# Patient Record
Sex: Female | Born: 1985
Health system: Southern US, Community
[De-identification: ages and names within clinical notes are randomized; demographics above are authoritative.]

## PROBLEM LIST (undated history)

## (undated) DIAGNOSIS — F419 Anxiety disorder, unspecified: Secondary | ICD-10-CM

## (undated) DIAGNOSIS — J189 Pneumonia, unspecified organism: Secondary | ICD-10-CM

## (undated) HISTORY — DX: Anxiety disorder, unspecified: F41.9

## (undated) HISTORY — DX: Pneumonia, unspecified organism: J18.9

---

## 2009-09-02 ENCOUNTER — Encounter: Payer: Self-pay | Admitting: Cardiology

## 2009-09-02 ENCOUNTER — Ambulatory Visit: Payer: Self-pay | Admitting: Family Medicine

## 2009-09-02 DIAGNOSIS — R079 Chest pain, unspecified: Secondary | ICD-10-CM | POA: Insufficient documentation

## 2009-09-03 ENCOUNTER — Encounter: Payer: Self-pay | Admitting: Family Medicine

## 2009-09-14 ENCOUNTER — Encounter: Payer: Self-pay | Admitting: Cardiology

## 2009-09-14 ENCOUNTER — Ambulatory Visit: Payer: Self-pay | Admitting: Diagnostic Radiology

## 2009-09-14 ENCOUNTER — Ambulatory Visit (HOSPITAL_BASED_OUTPATIENT_CLINIC_OR_DEPARTMENT_OTHER): Admission: RE | Admit: 2009-09-14 | Discharge: 2009-09-14 | Payer: Self-pay | Admitting: Cardiology

## 2009-09-14 ENCOUNTER — Ambulatory Visit: Payer: Self-pay | Admitting: Cardiology

## 2010-07-25 NOTE — Assessment & Plan Note (Signed)
Summary: [facility]   Visit Type:  Initial Consult  CC:  chest pain.  History of Present Illness: 25 yo female for evaluation of chest pain. No prior cardiac history. She typically does not have dyspnea on exertion, orthopnea, PND, pedal edema, palpitations, syncope or exertional chest pain. She states she had a pneumonia approximately 2 years ago. Since then she has had occasional chest pain under her left breast. It is described as a sharp pain. It does not radiate. It is not exertional or related to food. He can increase with inspiration. It lasts one to 2 seconds and resolve spontaneously. Because of her chest pain she presented for further evaluation.  Current Medications (verified): 1)  Depo-Provera 150 Mg/ml Susp (Medroxyprogesterone Acetate) .... Injection Every Three Months  Allergies (verified): No Known Drug Allergies  Past History:  Past Medical History: Pneumonia  Past Surgical History: Reviewed history from 09/02/2009 and no changes required. Denies surgical history  Family History: Reviewed history from 09/02/2009 and no changes required. Family History Diabetes 1st degree relative Family History Hypertension No premature CAD in immediate family  Social History: Reviewed history from 09/02/2009 and no changes required. Single Current Smoker - socially Alcohol use-yes very seldomly Drug use-no Regular exercise-yes Full Time  Review of Systems       no fevers or chills, productive cough, hemoptysis, dysphasia, odynophagia, melena, hematochezia, dysuria, hematuria, rash, seizure activity, orthopnea, PND, pedal edema, claudication. Remaining systems are negative.   Vital Signs:  Patient profile:   25 year old female Height:      63 inches Weight:      124.50 pounds BMI:     22.13 Pulse rate:   60 / minute Pulse rhythm:   regular Resp:     18 per minute BP sitting:   110 / 70  (left arm) Cuff size:   regular  Vitals Entered By: Vikki Ports  (September 14, 2009 9:29 AM)  Physical Exam  General:  Well developed/well nourished in NAD Skin warm/dry Patient not depressed No peripheral clubbing Back-normal HEENT-normal/normal eyelids Neck supple/normal carotid upstroke bilaterally; no bruits; no JVD; no thyromegaly chest - CTA/ normal expansion CV - RRR/normal S1 and S2; no murmurs, rubs or gallops;  PMI nondisplaced Abdomen -NT/ND, no HSM, no mass, + bowel sounds, no bruit 2+ femoral pulses, no bruits Ext-no edema, chords, 2+ DP Neuro-grossly nonfocal     EKG  Procedure date:  09/03/2009  Findings:      Normal sinus rhythm with no ST changes.  Impression & Recommendations:  Problem # 1:  CHEST PAIN (ICD-786.50) Etiology of symptoms unclear. I do not think they are consistent with cardiac pain. Her electrocardiogram is normal. I also do not think she is having pulmonary emboli as she has had these intermittently for 2 years and it lasts one to 2 seconds. There is a pleuritic component and she states they began with her previous pneumonia. I wonder if she has some residual pleuritis. I will plan to repeat a PA and lateral chest x-ray. If it is normal I last her to followup with her primary care for further management and evaluation. Orders: T-2 View CXR ([id number])  Patient Instructions: 1)  Your physician recommends that you schedule a follow-up appointment in: AS NEEDED

## 2010-07-25 NOTE — Assessment & Plan Note (Signed)
Summary: sporadiac chest discomfort x 1-2 wks rm 3   Vital Signs:  Patient Profile:   25 Years Old Female CC:      chest discomfort x 1-2 wks Height:     63 inches Weight:      124 pounds O2 Sat:      100 % O2 treatment:    Room Air Temp:     97.2 degrees F oral Pulse rate:   76 / minute Pulse rhythm:   regular Resp:     16 per minute BP sitting:   129 / 88  (right arm) Cuff size:   regular  Vitals Entered By: Areta Haber CMA (September 02, 2009 4:08 PM)                  Current Allergies: No known allergies History of Present Illness Chief Complaint: chest discomfort x 1-2 wks History of Present Illness: Subjective:  Patient complains of one year history of occasional, very sporadic, brief left anterior chest pain.  The pain resolves spontaneously and does not radiate.  She also has an occasional brief uncomfortable "shuddering" sensation in her anterior chest.  The symptoms are more likely to occur in cold outside air.  Does not occur with strenous activity.  No shortness of breath.  No nausea/vomiting.  No cough or shortness of breath.  No fevers, chills, and sweats.  No GI or GU symptoms. She feels that the pains are occuring more frequently.  She seems to have had more during the past 2 weeks.  She had 2 episodes of sharp pain last night and an episode of brief "shuddering" pain this morning.  Current Problems: CHEST PAIN (ICD-786.50) FAMILY HISTORY DIABETES 1ST DEGREE RELATIVE (ICD-V18.0)   Current Meds DEPO-PROVERA 150 MG/ML SUSP (MEDROXYPROGESTERONE ACETATE) injection every three months  REVIEW OF SYSTEMS Constitutional Symptoms      Denies fever, chills, night sweats, weight loss, weight gain, and fatigue.  Eyes       Denies change in vision, eye pain, eye discharge, glasses, contact lenses, and eye surgery. Ear/Nose/Throat/Mouth       Denies hearing loss/aids, change in hearing, ear pain, ear discharge, dizziness, frequent runny nose, frequent nose bleeds,  sinus problems, sore throat, hoarseness, and tooth pain or bleeding.  Respiratory       Denies dry cough, productive cough, wheezing, shortness of breath, asthma, bronchitis, and emphysema/COPD.  Cardiovascular       Denies murmurs, chest pain, and tires easily with exhertion.    Gastrointestinal       Denies stomach pain, nausea/vomiting, diarrhea, constipation, blood in bowel movements, and indigestion. Genitourniary       Denies painful urination, kidney stones, and loss of urinary control. Neurological       Denies paralysis, seizures, and fainting/blackouts. Musculoskeletal       Denies muscle pain, joint pain, joint stiffness, decreased range of motion, redness, swelling, muscle weakness, and gout.  Skin       Denies bruising, unusual mles/lumps or sores, and hair/skin or nail changes.  Psych       Denies mood changes, temper/anger issues, anxiety/stress, speech problems, depression, and sleep problems. Other Comments: Pt states that she's been having chest discomfort for over a year and has felt more kind of discomfort in the last two weeks. Pt states that she is not in any pain and has not been sick. Pt states she just feels a little flutter every now and then in her chest. Pt has not seen  her PCP for this.   Past History:  Past Medical History: Unremarkable  Past Surgical History: Denies surgical history  Family History: Family History Diabetes 1st degree relative Family History Hypertension  Social History: Single Current Smoker - socially Alcohol use-yes very seldomly Drug use-no Regular exercise-yes Smoking Status:  current Drug Use:  no Does Patient Exercise:  yes   Objective:  Appearance:  Patient appears healthy, stated age, and in no acute distress  Eyes:  Pupils are equal, round, and reactive to light and accomdation.  Extraocular movement is intact.  Conjunctivae are not inflamed.  Pharynx:  Normal  Neck:  Supple.  No adenopathy is present.  No  thyromegaly is present  Lungs:  Clear to auscultation.  Breath sounds are equal.  Heart:  Regular rate and rhythm without murmurs, rubs, or gallops.  Chest:  No tenderness to palpation Abdomen:  Nontender without masses or hepatosplenomegaly.  Bowel sounds are present.  No CVA or flank tenderness.  Extremities:  No edema.  Pedal pulses are full and equal.  EKG:  Normal Assessment New Problems: CHEST PAIN (ICD-786.50) FAMILY HISTORY DIABETES 1ST DEGREE RELATIVE (ICD-V18.0)  INTERMITTENT CHEST PAIN; ?mitral valve prolapse  Plan New Orders: New Patient Level III [16109] EKG w/ Interpretation [93000] Planning Comments:   Recommend followup evaluation by cardiologist. Return for worsening symptoms  (to ER at night)   The patient and/or caregiver has been counseled thoroughly with regard to medications prescribed including dosage, schedule, interactions, rationale for use, and possible side effects and they verbalize understanding.  Diagnoses and expected course of recovery discussed and will return if not improved as expected or if the condition worsens. Patient and/or caregiver verbalized understanding.

## 2011-02-05 ENCOUNTER — Encounter: Payer: Self-pay | Admitting: Cardiology

## 2012-04-13 ENCOUNTER — Encounter: Payer: Self-pay | Admitting: *Deleted

## 2012-04-13 ENCOUNTER — Emergency Department
Admission: EM | Admit: 2012-04-13 | Discharge: 2012-04-13 | Disposition: A | Discharge status: Home / Self Care | Payer: BC Managed Care – PPO | Source: Home / Self Care | Attending: Family Medicine | Admitting: Family Medicine

## 2012-04-13 DIAGNOSIS — T6391XA Toxic effect of contact with unspecified venomous animal, accidental (unintentional), initial encounter: Secondary | ICD-10-CM

## 2012-04-13 DIAGNOSIS — T63481A Toxic effect of venom of other arthropod, accidental (unintentional), initial encounter: Secondary | ICD-10-CM

## 2012-04-13 NOTE — ED Provider Notes (Signed)
History     CSN: [id number]  Arrival date & time 04/13/12  1416   First MD Initiated Contact with Patient 04/13/12 1445      No chief complaint on file.    HPI Comments: Patient complains of a rash on her right upper arm. She was stung by a bee two days ago.  She had swelling at night but tried a home remedy and the swelling decreased.  At present she has only mild redness at the site and complains of persistent itching.  She is now approximately [redacted] weeks pregnant and wonders what she can use for the itching.  Patient is a 26 y.o. female presenting with rash. The history is provided by the patient.  Rash  This is a new problem. The current episode started 2 days ago. The problem has been gradually improving. The problem is associated with an insect bite/sting. There has been no fever. The rash is present on the left arm. The pain is at a severity of 0/10. The patient is experiencing no pain. Associated symptoms include itching. Pertinent negatives include no blisters, no pain and no weeping. She has tried nothing for the symptoms.    Past Medical History  Diagnosis Date  . Pneumonia     History reviewed. No pertinent past surgical history.  Family History  Problem Relation Age of Onset  . Diabetes Other     1t degree relative  . Hypertension Other     History  Substance Use Topics  . Smoking status: Current Some Day Smoker  . Smokeless tobacco: Not on file   Comment: socially  . Alcohol Use: Yes     very seldom    OB History    Grav Para Term Preterm Abortions TAB SAB Ect Mult Living   1               Review of Systems  Skin: Positive for itching and rash.  All other systems reviewed and are negative.    Allergies  Review of patient's allergies indicates no known allergies.  Home Medications   Current Outpatient Rx  Name Route Sig Dispense Refill  . MEDROXYPROGESTERONE ACETATE 150 MG/ML IM SUSP Intramuscular Inject 150 mg into the muscle every 3 (three)  months.        BP 122/83  Pulse 78  Temp 98.7 F (37.1 C) (Oral)  Resp 16  Ht 5\' 2"  (1.575 m)  Wt 127 lb 8 oz (57.834 kg)  BMI 23.32 kg/m2  SpO2 100%  LMP 03/15/2012  Physical Exam  Nursing note and vitals reviewed. Constitutional: She is oriented to person, place, and time. She appears well-developed and well-nourished. No distress.  HENT:  Head: Normocephalic.  Eyes: Conjunctivae normal and EOM are normal. Pupils are equal, round, and reactive to light.  Lymphadenopathy:    She has no cervical adenopathy.  Neurological: She is alert and oriented to person, place, and time.  Skin: Skin is warm and dry. Rash noted. Rash is macular. Rash is not papular, not pustular, not vesicular and not urticarial.          On the left upper arm triceps area just distal to axilla is a 5cm by 8cm patch of faint, but minimal, erythema.  No swelling, warmth, or tenderness.  Erythema appears to be resolving.    ED Course  Procedures none      1. Insect sting; no evidence cellulitis.  Local toxic effect appears to be resolving.  MDM   Apply cold compress 3 or 4 times daily.  May apply Benadryl cream 2 or 3 times daily.  Followup with family doctor if increased redness, swelling, warmth occurs.         Lattie Haw, MD 04/15/12 [phone]

## 2012-04-13 NOTE — ED Notes (Signed)
Patient c/o rash. Patient states got stung by a bee on Friday 04/11/2012 had swelling at night but tried home remedy and it helped swelling.

## 2014-04-26 ENCOUNTER — Encounter: Payer: Self-pay | Admitting: *Deleted

## 2015-12-07 ENCOUNTER — Emergency Department (INDEPENDENT_AMBULATORY_CARE_PROVIDER_SITE_OTHER)
Admission: EM | Admit: 2015-12-07 | Discharge: 2015-12-07 | Disposition: A | Discharge status: Home / Self Care | Payer: BLUE CROSS/BLUE SHIELD | Source: Home / Self Care

## 2015-12-07 ENCOUNTER — Encounter: Payer: Self-pay | Admitting: Emergency Medicine

## 2015-12-07 DIAGNOSIS — W57XXXA Bitten or stung by nonvenomous insect and other nonvenomous arthropods, initial encounter: Secondary | ICD-10-CM | POA: Diagnosis not present

## 2015-12-07 DIAGNOSIS — T148 Other injury of unspecified body region: Secondary | ICD-10-CM | POA: Diagnosis not present

## 2015-12-07 NOTE — ED Notes (Signed)
Pt c/o insect bite on her right lower leg. She noticed this today. C/o mild but sharp pain with movement. She is breastfeeding.

## 2015-12-07 NOTE — Discharge Instructions (Signed)
Bee, Wasp, or [employer], wasps, and hornets are part of a family of insects that can sting people. These stings can cause pain and inflammation, but they are usually not serious. However, some people may have an allergic reaction to a sting. This can cause the symptoms to be more severe.  SYMPTOMS  Common symptoms of this condition include:   A red lump in the skin that sometimes has a tiny hole in the center. In some cases, a stinger may be in the center of the wound.  Pain and itching at the sting site.  Redness and swelling around the sting site. If you have an allergic reaction (localized allergic reaction), the swelling and redness may spread out from the sting site. In some cases, this reaction can continue to develop over the next 12-36 hours. In rare cases, a person may have a severe allergic reaction (anaphylactic reaction) to a sting. Symptoms of an anaphylactic reaction may include:   Wheezing or difficulty breathing.  Raised, itchy, red patches on the skin.  Nausea or vomiting.  Abdominal cramping.  Diarrhea.  Chest pain.  Fainting.  Redness of the face (flushing). DIAGNOSIS  This condition is usually diagnosed based on symptoms, medical history, and a physical exam. TREATMENT  Most stings can be treated with:   Icing to reduce swelling.  Medicines (antihistamines) to treat itching or an allergic reaction.  Medicines to help reduce pain. These may be medicines that you take by mouth, or medicated creams or lotions that you apply to your skin. If you were stung by a bee, the stinger and a small sac of poison may be in the wound. This may be removed by brushing across it with a flat card, such as a credit card. Another method is to pinch the area and pull it out. These methods can help reduce the severity of the body's reaction to the sting.  HOME CARE INSTRUCTIONS   Wash the sting site daily with soap and water as told by your health care provider.  Apply  or take over-the-counter and prescription medicines only as told by your health care provider.  If directed, apply ice to the sting area.  Put ice in a plastic bag.  Place a towel between your skin and the bag.  Leave the ice on for 20 minutes, 2-3 times per day.  Do not scratch the sting area.  To lessen pain, try using a paste that is made of water and baking soda. Rub the paste on the sting area and leave it on for 5 minutes.  If you had a severe allergic reaction to a sting, you may need:  To wear a medical bracelet or necklace that lists the allergy.  To learn when and how to use an anaphylaxis kit or epinephrine injection. Your family members may also need to learn this.  To carry an anaphylaxis kit with you at all times. SEEK MEDICAL CARE IF:   Your symptoms do not get better in 2-3 days.  You have redness, swelling, or pain that spreads beyond the area of the sting.  You have a fever. SEEK IMMEDIATE MEDICAL CARE IF:  You have symptoms of a severe allergic reaction. These include:   Wheezing or difficulty breathing.  Chest pain.  Light-headedness or fainting.  Itchy, raised, red patches on the skin.  Nausea or vomiting.  Abdominal cramping.  Diarrhea.   This information is not intended to replace advice given to you by your health care provider.  Make sure you discuss any questions you have with your health care provider. °  °Document Released: 06/11/2005 Document Revised: 03/02/2015 Document Reviewed: 10/27/2014 °Elsevier Interactive Patient Education ©2016 Elsevier Inc[employer] ° °

## 2015-12-07 NOTE — ED Provider Notes (Signed)
CSN: [id number]     Arrival date & time 12/07/15  1822 History   None    Chief Complaint  Patient presents with  . Insect Bite   (Consider location/radiation/quality/duration/timing/severity/associated sxs/prior Treatment) Patient is a 30 y.o. female presenting with leg pain. The history is provided by the patient. No language interpreter was used.  Leg Pain Location:  Leg Time since incident:  1 day Injury: no   Leg location:  R leg Pain details:    Quality:  Aching   Radiates to:  Does not radiate   Severity:  No pain   Onset quality:  Sudden   Duration:  1 day   Timing:  Constant   Progression:  Worsening Chronicity:  New Dislocation: no   Foreign body present:  No foreign bodies Prior injury to area:  No Relieved by:  Nothing Worsened by:  Nothing tried Ineffective treatments:  None tried Risk factors: no concern for non-accidental trauma   Pt thinks she was bitten by something.  Pt has stinging and burning.  Pt complains of sore area to right leg.  No reness no swelling  Past Medical History  Diagnosis Date  . Pneumonia    History reviewed. No pertinent past surgical history. Family History  Problem Relation Age of Onset  . Diabetes Other     1t degree relative  . Hypertension Other    Social History  Substance Use Topics  . Smoking status: Current Some Day Smoker  . Smokeless tobacco: None     Comment: socially  . Alcohol Use: Yes     Comment: very seldom   OB History    Gravida Para Term Preterm AB TAB SAB Ectopic Multiple Living   1              Review of Systems  All other systems reviewed and are negative.   Allergies  Review of patient's allergies indicates no known allergies.  Home Medications   Prior to Admission medications   Medication Sig Start Date End Date Taking? Authorizing Provider  medroxyPROGESTERone (DEPO-PROVERA) 150 MG/ML injection Inject 150 mg into the muscle every 3 (three) months.      Historical Provider, MD   Meds  Ordered and Administered this Visit  Medications - No data to display  BP 113/73 mmHg  Pulse 68  Temp(Src) 98.2 F (36.8 C) (Oral)  Wt 132 lb (59.875 kg)  SpO2 99%  Breastfeeding? Unknown No data found.   Physical Exam  Constitutional: She appears well-developed and well-nourished.  HENT:  Head: Normocephalic.  Eyes: Pupils are equal, round, and reactive to light.  Musculoskeletal: She exhibits tenderness.  No redness no swelling no evidence of bite or sting.    Neurological: She is alert.  Skin: Skin is warm.  Vitals reviewed.   ED Course  Procedures (including critical care time)  Labs Review Labs Reviewed - No data to display  Imaging Review No results found.   Visual Acuity Review  Right Eye Distance:   Left Eye Distance:   Bilateral Distance:    Right Eye Near:   Left Eye Near:    Bilateral Near:         MDM   1. Insect bite    OTC hydrocortisone Benadryl if itching    Lonia SkinnerLeslie K JonestownSofia, [place] JerseyPA-C 12/08/15 [phone]

## 2015-12-14 DIAGNOSIS — H02843 Edema of right eye, unspecified eyelid: Secondary | ICD-10-CM | POA: Diagnosis not present

## 2015-12-15 ENCOUNTER — Emergency Department (INDEPENDENT_AMBULATORY_CARE_PROVIDER_SITE_OTHER)
Admission: EM | Admit: 2015-12-15 | Discharge: 2015-12-15 | Disposition: A | Discharge status: Home / Self Care | Payer: BLUE CROSS/BLUE SHIELD | Source: Home / Self Care | Attending: Family Medicine | Admitting: Family Medicine

## 2015-12-15 ENCOUNTER — Telehealth: Payer: Self-pay | Admitting: Emergency Medicine

## 2015-12-15 ENCOUNTER — Encounter: Payer: Self-pay | Admitting: *Deleted

## 2015-12-15 DIAGNOSIS — H01001 Unspecified blepharitis right upper eyelid: Secondary | ICD-10-CM | POA: Diagnosis not present

## 2015-12-15 MED ORDER — ERYTHROMYCIN 5 MG/GM OP OINT: TOPICAL_OINTMENT | OPHTHALMIC | Status: DC

## 2015-12-15 MED ORDER — POLYMYXIN B-TRIMETHOPRIM 10000-0.1 UNIT/ML-% OP SOLN: 1.0000 [drp] | OPHTHALMIC | Status: DC

## 2015-12-15 NOTE — Telephone Encounter (Signed)
Was advised by [facility] polytrim ophthalmic drops are on back-order due to short supply from manufacturer.  Changed antibiotic to erythromycin ointment.  Please notify pt of change of medication.

## 2015-12-15 NOTE — Discharge Instructions (Signed)
Blepharitis Blepharitis is inflammation of the eyelids. Blepharitis may happen with:  Reddish, scaly skin around the scalp and eyebrows.  Burning or itching of the eyelids.  Eye discharge at night that causes the eyelashes to stick together in the morning.  Eyelashes that fall out.  Sensitivity to light. HOME CARE INSTRUCTIONS Pay attention to any changes in how you look or feel. Follow these instructions to help with your condition: Keeping Clean  Wash your hands often.  Wash your eyelids with warm water or with warm water that is mixed with a small amount of baby shampoo. Do this two times per day or as often as needed.  Wash your face and eyebrows at least once a day.  Use a clean towel each time you dry your eyelids. Do not use this towel to clean or dry other areas of your body. Do not share your towel with anyone. General Instructions  Avoid wearing makeup until you get better. Do not share makeup with anyone.  Avoid rubbing your eyes.  Apply warm compresses to your eyes 2 times per day for 10 minutes at a time, or as told by your health care provider.  If you were prescribed an antibiotic ointment or steroid drops, apply or use the medicine as told by your health care provider. Do not stop using the medicine even if you feel better.  Keep all follow-up visits as told by your health care provider. This is important. SEEK MEDICAL CARE IF:  Your eyelids feel hot.  You have blisters or a rash on your eyelids.  The condition does not go away in 2-4 days.  The inflammation gets worse. SEEK IMMEDIATE MEDICAL CARE IF:  You have pain or redness that gets worse or spreads to other parts of your face.  Your vision changes.  You have pain when looking at lights or moving objects.  You have a fever.   This information is not intended to replace advice given to you by your health care provider. Make sure you discuss any questions you have with your health care  provider.   Document Released: 06/08/2000 Document Revised: 03/02/2015 Document Reviewed: 10/04/2014 Elsevier Interactive Patient Education 2016 Elsevier Inc.  

## 2015-12-15 NOTE — ED Provider Notes (Signed)
CSN: [id number]     Arrival date & time 12/15/15  1515 History   First MD Initiated Contact with Patient 12/15/15 1541     Chief Complaint  Patient presents with  . Facial Swelling    right eye   (Consider location/radiation/quality/duration/timing/severity/associated sxs/prior Treatment) HPI  Cathy Wilson is a 30 y.o. female presenting to UC with c/o Right upper eyelid erythema and edema for about 3 days.  Symptoms started after she was outside 3 days ago. Associated mild itching.  She tried using visine red eye and benadryl w/o relief. She notes she is breastfeeding and noticed decreased milk supply after taking her benadryl.  She was seen by her PCP yesterday who prescribed keflex for possible cellulitis, however, when she called today as she was hesitant starting prescription medication, pt felt she received conflicting direction as to whether she should take the medication or not. She does not wear contacts. Denies trauma to the eye. Denies change in vision. Denies fever, congestion, or pain to eye itself.  She does note having a "sinus infection" 1-2 weeks ago but was never on antibiotics and notes symptoms resolved on their own.    Past Medical History  Diagnosis Date  . Pneumonia    History reviewed. No pertinent past surgical history. Family History  Problem Relation Age of Onset  . Diabetes Other     1t degree relative  . Hypertension Other    Social History  Substance Use Topics  . Smoking status: Never Smoker   . Smokeless tobacco: None     Comment: socially  . Alcohol Use: Yes     Comment: very seldom   OB History    Gravida Para Term Preterm AB TAB SAB Ectopic Multiple Living   1              Review of Systems  Constitutional: Negative for fever and chills.  HENT: Negative for congestion, ear pain, rhinorrhea and sore throat.   Eyes: Positive for redness (Right upper eyelid) and itching. Negative for photophobia, pain and visual disturbance.  Respiratory:  Negative for cough and shortness of breath.   Gastrointestinal: Negative for nausea and vomiting.  Neurological: Negative for dizziness, light-headedness and headaches.    Allergies  Review of patient's allergies indicates no known allergies.  Home Medications   Prior to Admission medications   Medication Sig Start Date End Date Taking? Authorizing Provider  Prenatal Vit-Fe Fumarate-FA (PRENATAL FA PO) Take by mouth.   Yes Historical Provider, MD  erythromycin ophthalmic ointment Place a 1/2 inch ribbon of ointment into the upper eyelid TID for 5 days 12/15/15   Junius FinnerErin O'Malley, PA-C  medroxyPROGESTERone (DEPO-PROVERA) 150 MG/ML injection Inject 150 mg into the muscle every 3 (three) months.      Historical Provider, MD   Meds Ordered and Administered this Visit  Medications - No data to display  BP 119/81 mmHg  Pulse 75  Temp(Src) 98.3 F (36.8 C) (Oral)  Wt 129 lb (58.514 kg)  SpO2 97% No data found.   Physical Exam  Constitutional: She is oriented to person, place, and time. She appears well-developed and well-nourished.  HENT:  Head: Normocephalic and atraumatic.  Right Ear: Tympanic membrane normal.  Left Ear: Tympanic membrane normal.  Nose: Nose normal.  Mouth/Throat: Uvula is midline, oropharynx is clear and moist and mucous membranes are normal.  Eyes: Conjunctivae and EOM are normal. Pupils are equal, round, and reactive to light.  Right upper eyelid erythema and edema. Mild tenderness.  No bleeding or discharge. No periorbital edema or tenderness.   Neck: Normal range of motion.  Cardiovascular: Normal rate.   Pulmonary/Chest: Effort normal.  Musculoskeletal: Normal range of motion.  Neurological: She is alert and oriented to person, place, and time.  Skin: Skin is warm and dry.  Psychiatric: She has a normal mood and affect. Her behavior is normal.  Nursing note and vitals reviewed.   ED Course  Procedures (including critical care time)  Labs Review Labs  Reviewed - No data to display  Imaging Review No results found.   Visual Acuity Review  Right Eye Distance: 20/15 Left Eye Distance: 20/15 Bilateral Distance: 20/15 (corrected with glasses)   MDM   1. Blepharitis of right upper eyelid    Pt c/o Right upper eyelid erythema and edema. No eye trauma. Pt does wear glasses but not contacts.  Exam c/w blepharitis of Right upper eyelid. Low concern for periorbital cellulitis at this time.  Initially prescribed polytrip ophthalmic drops, however, was advised by [facility] that medication is on backorder due from manufacturer.  Antibiotic changed to erythromycin ointment, TID for 5 days.  Encouraged alternating cool and warm compresses.   Pt info packet for blepharitis provided. Encouraged f/u with PCP or eye doctor in 4-5 days if not improving, sooner if worsening. Patient verbalized understanding and agreement with treatment plan.     Junius Finnerrin O'Malley, PA-C 12/15/15 1626

## 2015-12-15 NOTE — ED Notes (Signed)
Pt reports after going outside 3 days ago, developed right eye itching, later that night some redness. Used Benadryl and visine red eye without relief. Eyelid has since become edematous, denies discharge. Seen by PCP office yesterday and feels she received conflicting direction what what to do or take so she is here for another opinion. No visual disturbances.

## 2015-12-16 DIAGNOSIS — H00031 Abscess of right upper eyelid: Secondary | ICD-10-CM | POA: Diagnosis not present

## 2016-08-10 DIAGNOSIS — F419 Anxiety disorder, unspecified: Secondary | ICD-10-CM | POA: Diagnosis not present

## 2016-08-10 DIAGNOSIS — Z01419 Encounter for gynecological examination (general) (routine) without abnormal findings: Secondary | ICD-10-CM | POA: Diagnosis not present

## 2016-08-10 DIAGNOSIS — Z1151 Encounter for screening for human papillomavirus (HPV): Secondary | ICD-10-CM | POA: Diagnosis not present

## 2016-08-10 DIAGNOSIS — Z6822 Body mass index (BMI) 22.0-22.9, adult: Secondary | ICD-10-CM | POA: Diagnosis not present

## 2016-08-10 HISTORY — DX: Anxiety disorder, unspecified: F41.9

## 2017-07-30 ENCOUNTER — Encounter: Payer: Self-pay | Admitting: Family Medicine

## 2017-07-30 ENCOUNTER — Encounter (INDEPENDENT_AMBULATORY_CARE_PROVIDER_SITE_OTHER): Payer: Self-pay

## 2017-07-30 ENCOUNTER — Ambulatory Visit (INDEPENDENT_AMBULATORY_CARE_PROVIDER_SITE_OTHER): Payer: BLUE CROSS/BLUE SHIELD

## 2017-07-30 ENCOUNTER — Ambulatory Visit (INDEPENDENT_AMBULATORY_CARE_PROVIDER_SITE_OTHER): Payer: BLUE CROSS/BLUE SHIELD | Admitting: Family Medicine

## 2017-07-30 DIAGNOSIS — M25532 Pain in left wrist: Secondary | ICD-10-CM | POA: Diagnosis not present

## 2017-07-30 DIAGNOSIS — M24832 Other specific joint derangements of left wrist, not elsewhere classified: Secondary | ICD-10-CM | POA: Diagnosis not present

## 2017-07-30 MED ORDER — DICLOFENAC SODIUM 1 % TD GEL: 4.0000 g | Freq: Four times a day (QID) | TRANSDERMAL | 11 refills | Status: AC

## 2017-07-30 NOTE — Patient Instructions (Addendum)
Thank you for coming in today. Get xray now Apply the voltaren gel up to 4x daily to the wrist.  Attend hand therapy.  Recheck in 6 weeks.  Use an OTC wrist brace as needed.  Get a flu shot this year.   Flexor Carpi Ulnaris and [employer] Radialis Tendinitis Rehab Ask your health care provider which exercises are safe for you. Do exercises exactly as told by your health care provider and adjust them as directed. It is normal to feel mild stretching, pulling, tightness, or discomfort as you do these exercises, but you should stop right away if you feel sudden pain or your pain gets worse.Do not begin these exercises until told by your health care provider. Stretching These exercises warm up your muscles and joints and improve the movement and flexibility of your forearm. These exercises also help to relieve pain, numbness, and tingling.These exercises are done using your healthy forearm to help stretch the muscles in your injured forearm. Exercise A: Wrist flexion, passive 1. Extend your __________ arm in front of you, relax your wrist, and point your fingers downward. 2. Gently push on the back of your hand until you feel a gentle stretch on the top of your forearm. 3. Hold this position for __________ seconds. 4. Slowly return to the starting position. Repeat __________ times. Complete this exercise __________ times a day. Exercise B: Wrist extension, passive 1. Extend your __________ arm in front of you and turn your palm upward. 2. Gently pull your palm and fingertips back so your fingers point downward. You should feel a gentle stretch on the palm-side of your forearm. 3. Hold this position for __________ seconds. 4. Slowly return to the starting position. Exercise C: Forearm rotation, palm up, passive 1. Sit with your __________ elbow bent to an "L" shape (90 degrees) with your forearm resting on a table. 2. Keeping your upper body and shoulder still, use your other hand to rotate  your forearm palm up until you feel a gentle to moderate stretch. 3. Hold this position for __________ seconds. 4. Slowly release the stretch, and return to the starting position. Repeat __________ times. Complete this exercise __________ times a day. Exercise D: Forearm rotation, palm down, passive 1. Sit with your __________ elbow bent to an "L" shape (90 degrees) with your forearm resting on a table. 2. Keeping your upper body and shoulder still, use your other hand to rotate your forearm palm down until you feel a gentle to moderate stretch. 3. Hold this position for __________ seconds. 4. Slowly release the stretch, and return to the starting position. Repeat __________ times. Complete this exercise __________ times a day. Range of motion exercises These exercises warm up your muscles and joints and improve the movement and flexibility of your forearm. These exercises also help to relieve pain, numbness, and tingling.These exercises are done using the muscles in your injured forearm. Exercise E: Wrist flexion, active 1. With your fingers relaxed, bend your wrist forward as far as you can. 2. Hold this position for __________ seconds. 3. Slowly return to the starting position. Repeat __________ times. Complete this exercise __________ times a day. Exercise F: Wrist extension, active 1. With your fingers relaxed, bend your wrist backward as far as you can. 2. Hold this position for __________ seconds. 3. Slowly return to the starting position. Repeat __________ times. Complete this exercise __________ times a day. Exercise G: Supination, active 1. Stand or sit with your arms at your sides. 2. [place] your  __________ elbow to an "L" shape (90 degrees). 3. Turn your palm upward until you feel a gentle stretch on the inside of your forearm. 4. Hold this position for __________ seconds. 5. Slowly return your palm to the starting position. Repeat __________ times. Complete this exercise  __________ times a day. Exercise H: Pronation, active 1. Stand or sit with your arms at your sides. 2. Bend your __________ elbow to an "L" shape (90 degrees). 3. Turn your palm downward until you feel a gentle stretch on the top of your forearm. 4. Hold this position for __________ seconds. 5. Slowly return your palm to the starting position. Repeat __________ times. Complete this exercise __________ times a day. Strengthening exercises These exercises build strength and endurance in your wrist and forearm. Endurance is the ability to use your muscles for a long time, even after they get tired. Exercise I: Wrist flexors 1. Sit with your __________ forearm supported on a table and your hand resting palm-up over the edge of the table. Your elbow should be below the level of your shoulder. 2. Hold a __________ weight in your __________ hand. Or, hold a rubber exercise band or tube in both hands, keeping your hands at the same level and hip distance apart. There should be a slight tension in the exercise band or tube. 3. Slowly curl your hand up toward your forearm. 4. Hold this position for __________ seconds. 5. Slowly lower your hand back to the starting position. Repeat __________ times. Complete this exercise __________ times a day. Exercise J: Wrist extensors 1. Sit with your __________ forearm supported on a table and your hand resting palm-down over the edge of the table. Your elbow should be below the level of your shoulder. 2. Hold a __________ weight in your __________ hand. Or, hold a rubber exercise band or tube in both hands, keeping your hands at the same level and hip distance apart. There should be a slight tension in the exercise band or tube. 3. Slowly curl your hand up toward your forearm. 4. Hold this position for __________ seconds. 5. Slowly lower your hand back to the starting position. Repeat __________ times. Complete this exercise __________ times a day. Exercise K:  Ulnar deviators 1. Stand with a __________ weight in your __________ hand. Or, sit with your healthy hand supported, and hold onto a rubber exercises band or tube. There should be a slight tension in the exercise band or tube. 2. Move your __________ wrist so your pinkie travels toward your forearm and your thumb moves away from your forearm. 3. Hold this position for __________ seconds. 4. Slowly lower your wrist to the starting position. Repeat __________ times. Complete this exercise __________ times a day. Exercise L: Radial deviators 1. Stand with a __________ weight in your __________ hand. Or, sit and hold onto a rubber exercise band or tube while your __________ arm is supported on a table and the other arm is below the table. There should be a slight tension in the exercise band or tube. ? If you are holding a weight, raise your __________ hand so your thumb moves toward your forearm at a comfortable height. You will not need to raise your hand very far. ? If you are holding an exercise band or tube, pull on it. 2. Hold this position for __________ seconds. 3. Slowly lower your wrist to the starting position. Repeat __________ times. Complete this exercise __________ times a day. Exercise M: Forearm rotation, palm up 1. Sit  with your __________ forearm supported on a table and your hand resting palm-down. Your elbow should be at your side, below the level of your shoulder, and bent to an "L" shape (about 90 degrees). Keep your wrist stable. Do not allow it to move backward or forward during the exercise. 2. Gently hold a lightweight hammer with your __________ hand. 3. Without moving your elbow or wrist, slowly rotate your palm upward to a thumbs-up position. 4. Hold this position for __________ seconds. 5. Slowly return your forearm to the starting position. Repeat __________ times. Complete this exercise __________ times a day. Exercise N: Forearm rotation, palm down 1. Sit with your  __________ forearm supported on a table and your hand resting palm-up. Your elbow should be at your side, below the level of your shoulder, and bent to an "L" shape (about 90 degrees). Keep your wrist stable. Do not allow it to move backward or forward during the exercise. 2. Gently hold a lightweight hammer with your __________ hand. 3. Without moving your elbow or wrist, slowly rotate your palm and hand upward to a thumbs-up position. 4. Hold this position for __________ seconds. 5. Slowly return your forearm to the starting position. Repeat __________ times. Complete this exercise __________ times a day. Exercise O: Grip 1. Hold one of these items in your __________ hand: modelling clay, therapy putty, a dense sponge, a stress ball, or a large, rolled sock. 2. Squeeze as hard as you can without increasing any pain. 3. Hold this position for __________ seconds. 4. Slowly release your grip. Repeat __________ times. Complete this exercise __________ times a day. This information is not intended to replace advice given to you by your health care provider. Make sure you discuss any questions you have with your health care provider. Document Released: 02/14/2016 Document Revised: 02/16/2016 Document Reviewed: 02/14/2016 Elsevier Interactive Patient Education  [employer].

## 2017-07-30 NOTE — Progress Notes (Signed)
Cathy Wilson is a 32 y.o. female who [facility] today for wrist pain.  Patient with one year history of dull, achy left wrist pain. Patient had panic attack last year and hit hand against wall. She did not seek specific care for wrist at that time, but did note significant swelling following injury. Since that time she was had, dull achy pain that is worse at the end of the day. Particularly bad with wrist flexion against resistance, like when she lifts toddler, puts away dishes, or lifts bucket/can. She states that some days pain is minimal, but the pain has been significantly worsening over the last month. Pain is located on ventral surface of wrist, worse on medial aspect than lateral. Heat helps the pain. She is not taking medication for the issue.    Past Medical History:  Diagnosis Date  . Anxiety 08/10/2016  . Pneumonia    No past surgical history on file. Social History   Tobacco Use  . Smoking status: Never Smoker  . Smokeless tobacco: Never Used  . Tobacco comment: socially  Substance Use Topics  . Alcohol use: Yes    Comment: very seldom   ROS:  No headache, visual changes, nausea, vomiting, diarrhea, constipation, dizziness, abdominal pain, skin rash, fevers, chills, night sweats, weight loss, swollen lymph nodes, body aches, joint swelling, muscle aches, chest pain, shortness of breath, mood changes, visual or auditory hallucinations.    Medications: Current Outpatient Medications  Medication Sig Dispense Refill  . Prenatal Vit-Fe Fumarate-FA (PRENATAL FA PO) Take by mouth.    . diclofenac sodium (VOLTAREN) 1 % GEL Apply 4 g topically 4 (four) times daily. To affected joint. 100 g 11  . sertraline (ZOLOFT) 50 MG tablet TAKE ONE TABLET (50 MG TOTAL) BY MOUTH DAILY.  3   No current facility-administered medications for this visit.    No Known Allergies   Exam:  BP 124/79   Pulse 76   Ht 5\' 2"  (1.575 m)    Wt 123 lb (55.8 kg)   BMI 22.50 kg/m  General: Well Developed, well nourished, and in no acute distress.  Neuro/Psych: Alert and oriented x3, extra-ocular muscles intact, able to move all 4 extremities, sensation grossly intact. Skin: Warm and dry, no rashes noted.  Respiratory: Not using accessory muscles, speaking in full sentences, trachea midline.  Cardiovascular: Pulses palpable, no extremity edema. Abdomen: Does not appear distended. MSK:  L Wrist: Normal in appearance without effusion or overlying ecchymosis/erythema. Tender to deep pressure along medial aspect of wrist.  Minimal tenderness to palpation along lateral wrist or flexor tendons. 5/5 strength on flexion/extension. No pain on flexion/extension against resistance.     Assessment and Plan: 32 y.o. female with chronic left wrist pain, worsening over last month. Fracture of wrist seems unlikely, but is possible given history of wrist trauma last year. Otherwise, injury is likely ligamentous in nature.  Will order wrist Xray to rule out fracture and arthritis. Patient will benefit from formal hand therapy once fracture is ruled out. Also prescribed diclofenac gel. Patient should return to follow up in 6 weeks. If not improving, will likely pursue MRI arthrogram moving forward.    Orders Placed This Encounter  Procedures  . DG Wrist Complete Left    Standing Status:   Future    Number of Occurrences:   1    Standing Expiration Date:   09/28/2018    Order Specific Question:   Reason for Exam (  SYMPTOM  OR DIAGNOSIS REQUIRED)    Answer:   eval persistant pain left flexor wrist following injury 1 year ago    Order Specific Question:   Is patient pregnant?    Answer:   No    Order Specific Question:   Preferred imaging location?    Answer:   [facility]    Order Specific Question:   Radiology Contrast Protocol - do NOT remove file path    Answer:   [email or web address]  .  Ambulatory referral to Occupational Therapy    Referral Priority:   Routine    Referral Type:   Occupational Therapy    Referral Reason:   Specialty Services Required    Requested Specialty:   Occupational Therapy    Number of Visits Requested:   1   Meds ordered this encounter  Medications  . diclofenac sodium (VOLTAREN) 1 % GEL    Sig: Apply 4 g topically 4 (four) times daily. To affected joint.    Dispense:  100 g    Refill:  11    Discussed warning signs or symptoms. Please see discharge instructions. Patient expresses understanding.

## 2017-09-09 ENCOUNTER — Ambulatory Visit: Payer: BLUE CROSS/BLUE SHIELD | Admitting: Family Medicine

## 2017-12-03 DIAGNOSIS — Z6823 Body mass index (BMI) 23.0-23.9, adult: Secondary | ICD-10-CM | POA: Diagnosis not present

## 2017-12-03 DIAGNOSIS — N8110 Cystocele, unspecified: Secondary | ICD-10-CM | POA: Diagnosis not present

## 2017-12-03 DIAGNOSIS — Z01419 Encounter for gynecological examination (general) (routine) without abnormal findings: Secondary | ICD-10-CM | POA: Diagnosis not present

## 2019-03-31 IMAGING — DX DG WRIST COMPLETE 3+V*L*
4 series · 4 of 4 positions shown · non-contrast
Comparison: None.

CLINICAL DATA: Pain following injury 1 year prior

EXAM:
LEFT WRIST - COMPLETE 3+ VIEW

[wrist pa]
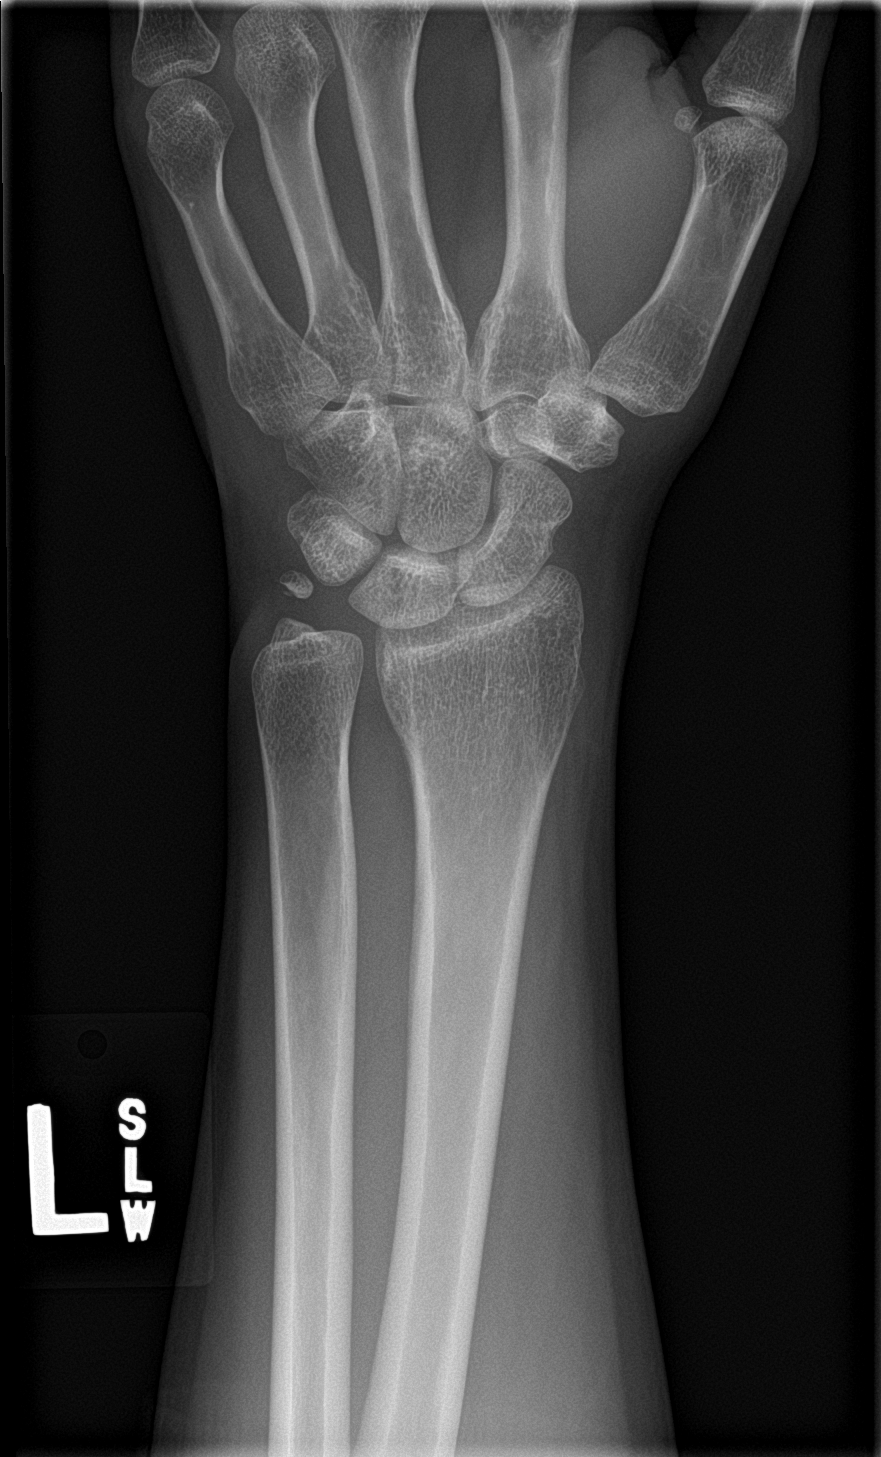

[wrist obl]
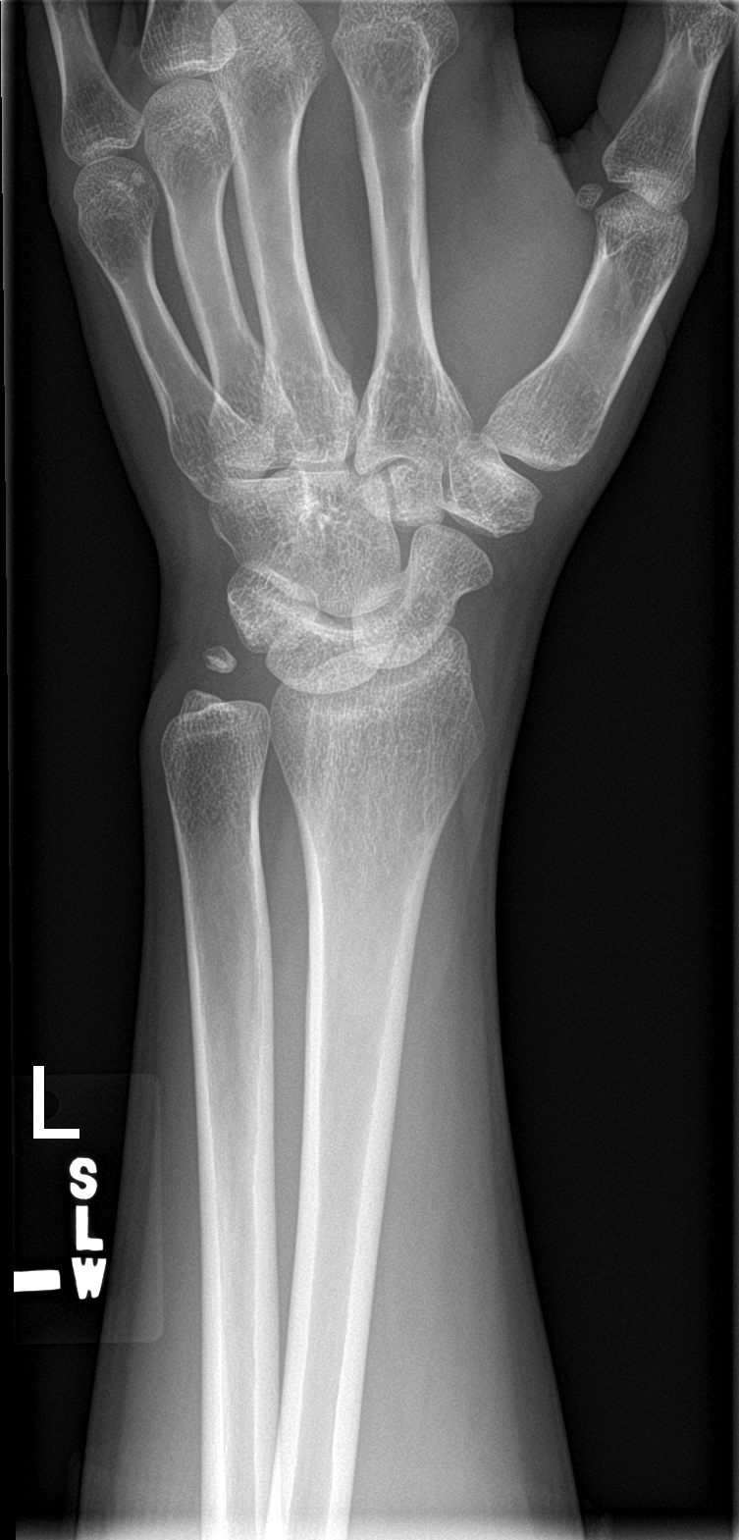

[wrist lat]
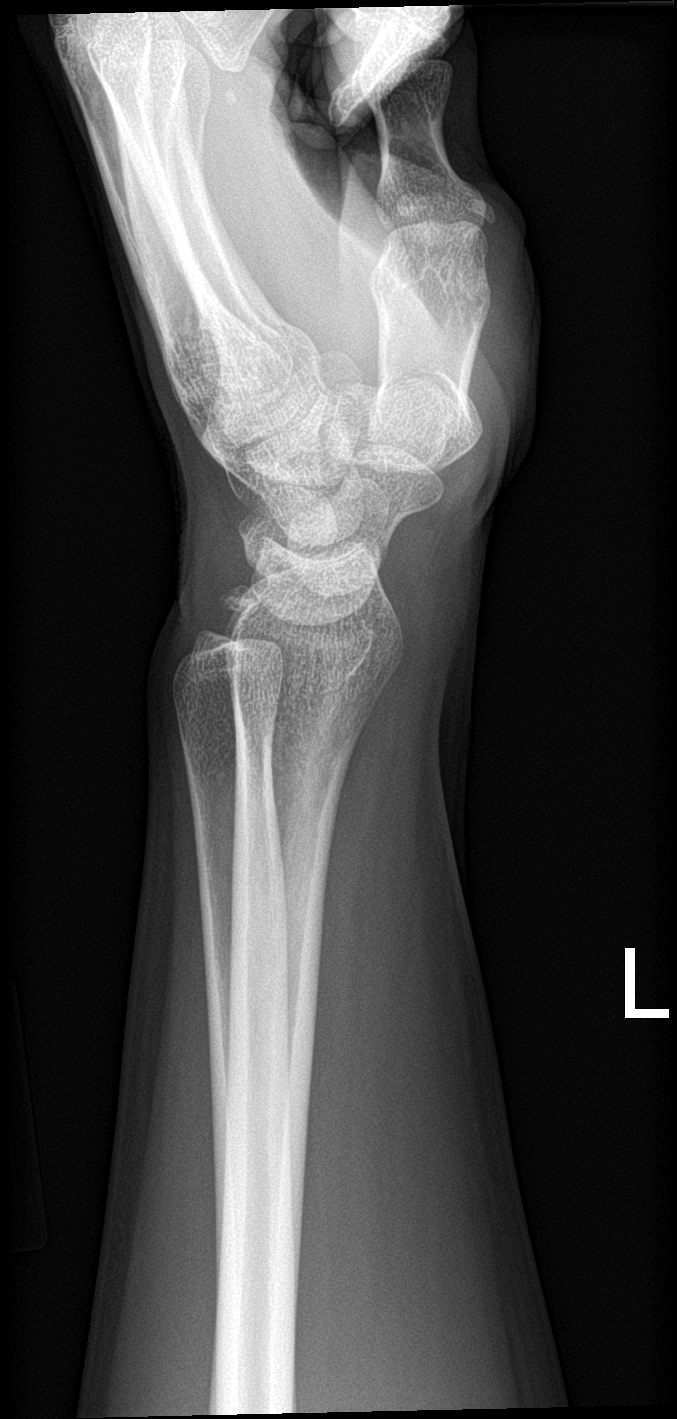

[wrist navicular]
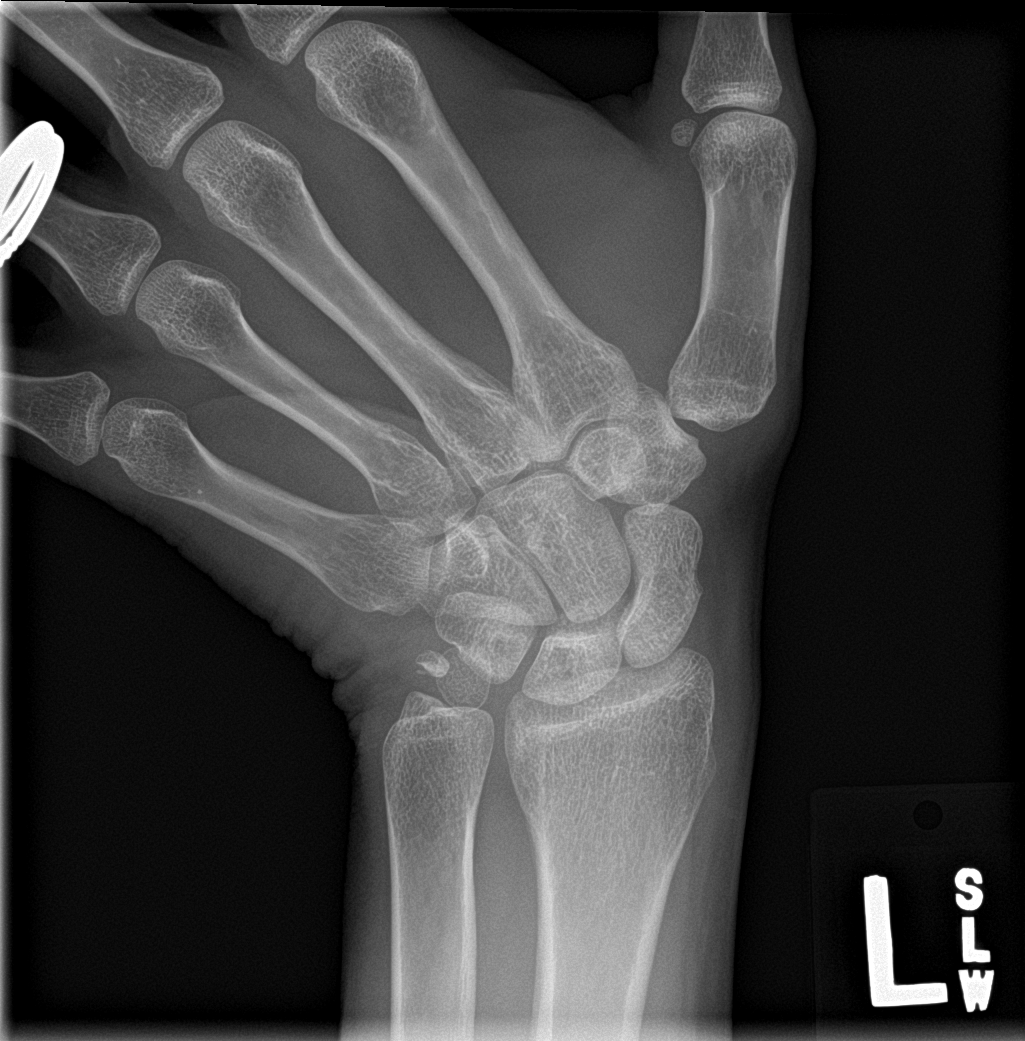

[4 of 4 positions shown; findings below may reference images not displayed]

FINDINGS: Frontal, oblique, lateral, and ulnar deviation scaphoid images were
obtained. There is evidence of an old avulsion of the ulnar styloid
with remodeling. No acute fracture or dislocation. Joint spaces
appear normal. No erosive change.
IMPRESSION: Old avulsion of the left ulnar styloid with remodeling in this area.
No acute fracture or dislocation. No evident arthropathic change.
